# Patient Record
Sex: Male | Born: 1980 | Hispanic: No | Marital: Single
Health system: Midwestern US, Community
[De-identification: ages and names within clinical notes are randomized; demographics above are authoritative.]

---

## 2022-03-11 ENCOUNTER — Inpatient Hospital Stay
Admit: 2022-03-11 | Discharge: 2022-03-11 | Disposition: A | Discharge status: Home / Self Care | Payer: Medicaid Other | Attending: Emergency Medicine

## 2022-03-11 ENCOUNTER — Emergency Department: Admit: 2022-03-11 | Payer: Medicaid Other

## 2022-03-11 DIAGNOSIS — R519 Headache, unspecified: Secondary | ICD-10-CM

## 2022-03-11 DIAGNOSIS — K859 Acute pancreatitis without necrosis or infection, unspecified: Secondary | ICD-10-CM

## 2022-03-11 LAB — CBC WITH AUTO DIFFERENTIAL
Absolute Immature Granulocyte: 0 10*3/uL (ref 0.0–0.5)
Basophils %: 1 % (ref 0.0–2.0)
Basophils Absolute: 0.1 10*3/uL (ref 0.0–0.2)
Eosinophils %: 3 % (ref 0.5–7.8)
Eosinophils Absolute: 0.2 10*3/uL (ref 0.0–0.8)
Hematocrit: 40.7 % — ABNORMAL LOW (ref 41.1–50.3)
Hemoglobin: 14 g/dL (ref 13.6–17.2)
Immature Granulocytes: 0 % (ref 0.0–5.0)
Lymphocytes %: 36 % (ref 13–44)
Lymphocytes Absolute: 3 10*3/uL (ref 0.5–4.6)
MCH: 30.7 PG (ref 26.1–32.9)
MCHC: 34.4 g/dL (ref 31.4–35.0)
MCV: 89.3 FL (ref 82.0–102.0)
MPV: 10.5 FL (ref 9.4–12.3)
Monocytes %: 10 % (ref 4.0–12.0)
Monocytes Absolute: 0.8 10*3/uL (ref 0.1–1.3)
Neutrophils %: 50 % (ref 43–78)
Neutrophils Absolute: 4.1 10*3/uL (ref 1.7–8.2)
Platelets: 170 10*3/uL (ref 150–450)
RBC: 4.56 M/uL (ref 4.23–5.6)
RDW: 12.4 % (ref 11.9–14.6)
WBC: 8.3 10*3/uL (ref 4.3–11.1)
nRBC: 0 10*3/uL (ref 0.0–0.2)

## 2022-03-11 LAB — COMPREHENSIVE METABOLIC PANEL
ALT: 24 U/L (ref 12–65)
AST: 17 U/L (ref 15–37)
Albumin/Globulin Ratio: 1 (ref 0.4–1.6)
Albumin: 3.5 g/dL (ref 3.5–5.0)
Alk Phosphatase: 99 U/L (ref 50–136)
Anion Gap: 8 mmol/L (ref 2–11)
BUN: 16 MG/DL (ref 6–23)
CO2: 27 mmol/L (ref 21–32)
Calcium: 8.9 MG/DL (ref 8.3–10.4)
Chloride: 109 mmol/L (ref 101–110)
Creatinine: 0.94 MG/DL (ref 0.8–1.5)
Est, Glom Filt Rate: >60 mL/min/{1.73_m2} (ref >60)
Globulin: 3.5 g/dL (ref 2.8–4.5)
Glucose: 117 mg/dL — ABNORMAL HIGH (ref 65–100)
Potassium: 3.8 mmol/L (ref 3.5–5.1)
Sodium: 144 mmol/L — ABNORMAL HIGH (ref 133–143)
Total Bilirubin: 0.4 MG/DL (ref 0.2–1.1)
Total Protein: 7 g/dL (ref 6.3–8.2)

## 2022-03-11 LAB — EKG 12-LEAD
Atrial Rate: 68 {beats}/min
P Axis: 49 degrees
P-R Interval: 144 ms
Q-T Interval: 391 ms
QRS Duration: 104 ms
QTc Calculation (Bazett): 416 ms
R Axis: 37 degrees
T Axis: 46 degrees
Ventricular Rate: 68 {beats}/min

## 2022-03-11 LAB — LIPASE: Lipase: 570 U/L — ABNORMAL HIGH (ref 73–393)

## 2022-03-11 MED ORDER — ACETAMINOPHEN 500 MG PO TABS
500 MG | ORAL | Status: DC
Start: 2022-03-11 — End: 2022-03-11

## 2022-03-11 MED ORDER — IBUPROFEN 400 MG PO TABS
400 MG | ORAL | Status: AC
Start: 2022-03-11 — End: 2022-03-11
  Administered 2022-03-11: 07:00:00 400 mg via ORAL

## 2022-03-11 MED FILL — IBUPROFEN 400 MG PO TABS: 400 MG | ORAL | Qty: 1

## 2022-03-11 NOTE — ED Notes (Signed)
I have reviewed discharge instructions with the patient.  The patient verbalized understanding.    Patient left ED via Discharge Method: ambulatory to Home with self.    Opportunity for questions and clarification provided.       Patient given 0 scripts.         To continue your aftercare when you leave the hospital, you may receive an automated call from our care team to check in on how you are doing.  This is a free service and part of our promise to provide the best care and service to meet your aftercare needs." If you have questions, or wish to unsubscribe from this service please call [phone].  Thank you for Choosing our [facility] Emergency Department.        Chase Picket [place], [place]  03/11/22 [phone]

## 2022-03-11 NOTE — Discharge Instructions (Addendum)
Your bloodwork showed eleavted pancreas enzymes otherwise normal. The CT of your head today was normal and did not show any bleeding. Please follow up with your primary care doctor in the next few weeks to have your blood pressure rechecked. Call them today to set up an appointment    [facility]. [facility]  [address] [place] [address]  [phone]

## 2022-03-11 NOTE — ED Provider Notes (Signed)
[name]  Emergency Department    DISPOSITION Decision To Discharge 03/11/2022 03:27:27 AM       ICD-10-CM    1. Acute nonintractable headache, unspecified headache type  R51.9       2. Acute pancreatitis without infection or necrosis, unspecified pancreatitis type  K85.90 [facility] [place]        ED Course     ED Course as of 03/11/22 4163   Fri Mar 11, 2022   5658 41 year old male no prior medical history presents with acute onset of postcoital headache.  Vitals within normal limits.  Neuro intact; NIH 0.  EKG without evidence of STEMI.  Will obtain labs and CT head to rule out aneurysm versus other acute process [ER]   0246 EKG: Normal sinus rhythm, rate 68.  PVCs.  No STEMI.  Normal axis and intervals.  Time: 0244 [ER]   0336 Blood work with elevated lipase otherwise normal.  CT head negative.  No evidence of aneurysm.  Had lengthy discussion with patient regarding results and findings and plan for follow-up.  Blood pressures remained stable here in the ER.  He is neuro intact.  Encouraged to follow-up with PCP and given referral for clinic.  He has had some issues with his pancreas before at this time he has minimal pain is able to tolerate p.o. without difficulty.  Stable for discharge home.  Return precautions given [ER]      ED Course User Index  [ER] Trenton Verne, Mary Sella, MD     Complexity of Problems Addressed:  1 or more stable acute illnesses    Data Reviewed and Analyzed:  Category 1:   I independently ordered and reviewed each unique test.     Category 2:   I independently ordered and interpreted the ED EKG in the absence of a Cardiologist.      Category 3: Discussion of management or test interpretation.  Please see ED course above    Risk of Complications and/or Morbidity of Patient Management:     Is this patient to be included in the SEP-1 core measure due to severe sepsis or septic shock? No Exclusion criteria - the patient is NOT to be  included for SEP-1 Core Measure due to: Infection is not suspected     HPI   Brian Sharp is a 41 y.o. male with a history of none who presents to the ED with complaint of multiple complaints.  States that this evening he had a cute onset of headache.  States headache came on post intercourse.  Has had mild headaches in the past but not normally this painful.  Rates it 5 out of 10.  Does have family history of aneurysm and states multiple of his aunts have died from aneurysms in the past.  Does endorse some tingling to his extremities and some generalized weakness primarily in his lower extremities.  He took his blood pressure at home is known to be systolic 150s.  He does endorse being under significant mount of stress and anxiety recently.  He does take Ativan for this and took that this evening as well.  Denies chest pain, shortness of breath, vision changes, focal weakness.  Also endorses acute on chronic tenderness in his epigastric region.  States he recently moved here from [place].  Symptoms occurred 1 hour prior to arrival    History   History reviewed. No pertinent past medical history.  History reviewed. No pertinent surgical  history.  History reviewed. No pertinent family history.  No Known Allergies    Physical Exam     Vitals:    03/11/22 0155 03/11/22 0245 03/11/22 0300 03/11/22 0322   BP: 132/60 116/67 110/71    Pulse: 71 72 69    Resp: 16 14 18     Temp: 97.7 F (36.5 C)      TempSrc: Oral      SpO2: 97% 95% 96%    Weight:    180 lb (81.6 kg)   Height:    5\' 11"  (1.803 m)     Nursing note and vitals reviewed.    Constitutional: Well developed, NAD  HEENT: Atraumatic, conjugate gaze, EOM intact  Neck: Supple  Cardiovascular: No cyanosis, diaphoresis, or JVD appreciated.   Respiratory: Effort normal. No respiratory distress.   Gastrointestinal: Non-distended. No guarding or rebound.  Very mild epigastric tenderness  MSK: No deformities appreciated. No peripheral edema.  Skin: Skin is warm and dry. No  rash appreciated.  Neuro: Alert and oriented, moves all four extremities.  5/5 strength throughout.  Sensation intact throughout.  Cranial nerves II through XII intact.  Psych: Pleasant and cooperative.    Procedures   Procedures    MDM     Labs Reviewed   CBC WITH AUTO DIFFERENTIAL - Abnormal; Notable for the following components:       Result Value    Hematocrit 40.7 (*)     All other components within normal limits   COMPREHENSIVE METABOLIC PANEL - Abnormal; Notable for the following components:    Sodium 144 (*)     Glucose 117 (*)     All other components within normal limits   LIPASE - Abnormal; Notable for the following components:    Lipase 570 (*)     All other components within normal limits     Medications   ibuprofen (ADVIL;MOTRIN) tablet 400 mg (400 mg Oral Given 03/11/22 0321)     CT Head W/O Contrast   Final Result      No acute intracranial abnormality.          Eliberto Ivory, M.D.    03/11/2022 2:49:00 AM        Voice dictation software was used during the making of this note.  This software is not perfect and grammatical and other typographical errors may be present.  This note has not been completely proofread for errors.     Lanette Hampshire, MD  03/11/22 [phone]

## 2022-03-11 NOTE — ED Triage Notes (Addendum)
Ambulatory to triage, 38 M states his blood pressure has been up and down at home and has been feeling fatigued x1 week. Pt states he took his ativan earlier for anxiety.

## 2022-03-26 ENCOUNTER — Inpatient Hospital Stay
Admit: 2022-03-26 | Discharge: 2022-03-27 | Disposition: A | Discharge status: Home / Self Care | Payer: MEDICAID | Attending: Emergency Medicine

## 2022-03-26 ENCOUNTER — Emergency Department: Admit: 2022-03-26 | Payer: MEDICAID

## 2022-03-26 DIAGNOSIS — R519 Headache, unspecified: Secondary | ICD-10-CM

## 2022-03-26 LAB — COMPREHENSIVE METABOLIC PANEL
ALT: 34 U/L (ref 12–65)
AST: 25 U/L (ref 15–37)
Albumin/Globulin Ratio: 1.1 (ref 0.4–1.6)
Albumin: 3.9 g/dL (ref 3.5–5.0)
Alk Phosphatase: 117 U/L (ref 50–136)
Anion Gap: 6 mmol/L (ref 2–11)
BUN: 15 MG/DL (ref 6–23)
CO2: 26 mmol/L (ref 21–32)
Calcium: 9 MG/DL (ref 8.3–10.4)
Chloride: 111 mmol/L — ABNORMAL HIGH (ref 101–110)
Creatinine: 1.1 MG/DL (ref 0.8–1.5)
Est, Glom Filt Rate: >60 mL/min/{1.73_m2} (ref >60)
Globulin: 3.5 g/dL (ref 2.8–4.5)
Glucose: 106 mg/dL — ABNORMAL HIGH (ref 65–100)
Potassium: 4.1 mmol/L (ref 3.5–5.1)
Sodium: 143 mmol/L (ref 133–143)
Total Bilirubin: 0.4 MG/DL (ref 0.2–1.1)
Total Protein: 7.4 g/dL (ref 6.3–8.2)

## 2022-03-26 LAB — POCT GLUCOSE
POC Glucose: 112 mg/dL — ABNORMAL HIGH (ref 65–100)
QC OK?: 112

## 2022-03-26 LAB — CBC WITH AUTO DIFFERENTIAL
Absolute Immature Granulocyte: 0 10*3/uL (ref 0.0–0.5)
Basophils %: 1 % (ref 0.0–2.0)
Basophils Absolute: 0 10*3/uL (ref 0.0–0.2)
Eosinophils %: 3 % (ref 0.5–7.8)
Eosinophils Absolute: 0.2 10*3/uL (ref 0.0–0.8)
Hematocrit: 44.4 % (ref 41.1–50.3)
Hemoglobin: 15.2 g/dL (ref 13.6–17.2)
Immature Granulocytes: 0 % (ref 0.0–5.0)
Lymphocytes %: 33 % (ref 13–44)
Lymphocytes Absolute: 2.6 10*3/uL (ref 0.5–4.6)
MCH: 30.6 PG (ref 26.1–32.9)
MCHC: 34.2 g/dL (ref 31.4–35.0)
MCV: 89.5 FL (ref 82–102)
MPV: 10.7 FL (ref 9.4–12.3)
Monocytes %: 11 % (ref 4.0–12.0)
Monocytes Absolute: 0.8 10*3/uL (ref 0.1–1.3)
Neutrophils %: 52 % (ref 43–78)
Neutrophils Absolute: 4 10*3/uL (ref 1.7–8.2)
Platelets: 183 10*3/uL (ref 150–450)
RBC: 4.96 M/uL (ref 4.23–5.6)
RDW: 12.4 % (ref 11.9–14.6)
WBC: 7.7 10*3/uL (ref 4.3–11.1)
nRBC: 0 10*3/uL (ref 0.0–0.2)

## 2022-03-26 LAB — TROPONIN: Troponin, High Sensitivity: 4.9 pg/mL (ref 0–14)

## 2022-03-26 LAB — PROTIME-INR
INR: 0.9
Protime: 12.3 s — ABNORMAL LOW (ref 12.6–14.3)

## 2022-03-26 MED ORDER — SODIUM CHLORIDE 0.9 % IV BOLUS
0.9 % | Freq: Once | INTRAVENOUS | Status: AC
Start: 2022-03-26 — End: 2022-03-26
  Administered 2022-03-26: 500 mL via INTRAVENOUS

## 2022-03-26 MED ORDER — KETOROLAC TROMETHAMINE 15 MG/ML IJ SOLN
15 MG/ML | INTRAMUSCULAR | Status: AC
Start: 2022-03-26 — End: 2022-03-26
  Administered 2022-03-26: 15 mg via INTRAVENOUS

## 2022-03-26 MED ORDER — IOPAMIDOL 76 % IV SOLN
76 % | Freq: Once | INTRAVENOUS | Status: AC | PRN
Start: 2022-03-26 — End: 2022-03-26
  Administered 2022-03-26: 100 mL via INTRAVENOUS

## 2022-03-26 MED FILL — KETOROLAC TROMETHAMINE 15 MG/ML IJ SOLN: 15 MG/ML | INTRAMUSCULAR | Qty: 1

## 2022-03-26 NOTE — ED Triage Notes (Signed)
Pt arrives with c/o numbness to left side of face, headache, tingling sensation to left side of face and left arm since waking up yesterday morning at 0700. States this morning he woke and noticed upper eyelid on left side was drooping and he had blurry vision. Also endorses nausea and mid abdominal pain. Has hx of pancreatitis.

## 2022-03-26 NOTE — ED Notes (Signed)
I have reviewed discharge instructions with the patient.  The patient verbalized understanding.    Patient left ED via Discharge Method: ambulatory to Home with self.    Opportunity for questions and clarification provided.       Patient given 1 scripts.         To continue your aftercare when you leave the hospital, you may receive an automated call from our care team to check in on how you are doing.  This is a free service and part of our promise to provide the best care and service to meet your aftercare needs." If you have questions, or wish to unsubscribe from this service please call [phone].  Thank you for Choosing our [facility] Emergency Department.        Jenne Campus, RN  03/26/22 2122

## 2022-03-26 NOTE — Discharge Instructions (Addendum)
As discussed your work-up here in the emergency department today was reassuring.  I do recommend close follow-up with primary care as planned next week.    As we discussed you can trial the Vistaril for anxiety, it can make you a little bit sleepy which can also help with your trouble falling asleep.  He only take this medication as needed when you are feeling anxious.    Follow-up with your PCP in 1 to 2 days if no improvement.  Return to the ER for any new or worsening symptoms.

## 2022-03-26 NOTE — ED Provider Notes (Addendum)
Emergency Department Provider Note       PCP: No, Pcp   Age: 41 y.o.   Sex: male     DISPOSITION Discharge - Pending Orders Complete 03/26/2022 08:47:36 PM       ICD-10-CM    1. Acute nonintractable headache, unspecified headache type  R51.9           Medical Decision Making     Complexity of Problems Addressed:  1 or more acute illnesses that pose a threat to life or bodily function.     Data Reviewed and Analyzed:   I independently ordered and reviewed each unique test.  I reviewed external records: ED visit note from an outside group.     I independently ordered and interpreted the ED EKG in the absence of a Cardiologist.    Rate: 79  EKG Interpretation: EKG Interpretation: sinus rhythm, no evidence of arrhythmia  ST Segments: Normal ST segments - NO STEMI      I interpreted the CT Scan no intracranial hemorrhage, no obvious arterial stenosis or occlusion on my limited read, agree with radiologist interpretation.    Discussion of management or test interpretation.  41 year old male presenting to the emergency department today for evaluation of left-sided headache, blurred vision and paresthesias in the left arm and leg.  He woke up with the symptoms yesterday.  Patient is overall very well-appearing here today.  He has no gross neurologic deficits on exam though reports some decreased sensation on the left side of the face, arm and leg but no complete numbness.  He has no weakness and is ambulatory without difficulty.    Discussed broad Ddx including ICH, CVA, hydrocephalus, migraine, cluster headache, anxiety, mass, electrolyte abnormality, aneurysm, bell's palsy. CT head without contrast and CTA are both negative for acute abnormality here today.  His labs are all reassuring and EKG does not show any signs of ischemia.  I discussed reassuring findings here in the emergency department with patient.  He does think that there may be some underlying anxiety component which I think is a possibility as well.  He  states he has been under increased stress recently since him, his wife and child moved to [place] away from their family in [place].  He states that he has an appointment set up with a new PCP next week for the first time to establish care, I encouraged him to keep this appointment for reevaluation.  We discussed symptoms that would warrant reevaluation in the emergency department.  He verbalized understanding and feels comfortable to be discharged home at this time.    I discussed this patient and all results with my attending, Dr. Lester Kinsman who agrees with disposition and plan of patient.      Risk of Complications and/or Morbidity of Patient Management:  Patient was discharged risks and benefits of hospitalization were considered.  Shared medical decision making was utilized in creating the patients health plan today.    ED Course as of 03/26/22 2100   Sat Mar 26, 2022   2026 CTA HEAD NECK W CONTRAST [KE]   2043 I reviewed all results with patient from work-up here in the emergency department.  He states that he thinks there may be some underlying anxiety component to his symptoms as he does have a history of anxiety and panic attacks in the past.  He states he was previously treated with Xanax but he did not like the way that it made him feel so he is not currently on  any medications.  He does have an appointment with a new primary care provider next week.  Discussed with patient that overall his work-up has been reassuring, he really has no risk factors for CVA, and I think an underlying anxiety component is certainly possible.  He would like to trial some Vistaril here and at home and follow-up with his primary care provider.  He will return to the ER if he develops any new or worsening symptoms. [KE]      ED Course User Index  [KE] Thayer Dallas, PA       Is this patient to be included in the SEP-1 core measure due to severe sepsis or septic shock? No Exclusion criteria - the patient is NOT to be  included for SEP-1 Core Measure due to: Infection is not suspected      History      41 year old male presenting to the emergency department today for evaluation of headache, blurred vision in the left eye and numbness on the left side of his face and in his upper and lower extremity on the left side.  He woke up with the symptoms yesterday morning. He comes in today as symptoms have not resolved. He denies any associated neck pain. He denies any history of migraines.  He was seen here 2 weeks ago for similar symptoms and had normal workup.  He also endorses some left sided chest pain as well today, no aggravating or alleviating factors. No pain currently.Marland Kitchen  He denies shortness of breath.  He reports some mild lightheadedness.  Denies any abdominal pain, nausea or vomiting.  He reports a history of anxiety but he is not currently on any anxiety medications.  He endorses a family history of aneurysms, stating 2 aunts have died from aneurysms which is his main concern today.    The history is provided by the patient. No language interpreter was used.        Review of Systems   Constitutional:  Negative for chills and fever.   Respiratory:  Negative for shortness of breath.    Cardiovascular:  Positive for chest pain.   Neurological:  Positive for numbness and headaches. Negative for speech difficulty.       Physical Exam     Vitals signs and nursing note reviewed:  Vitals:    03/26/22 1730 03/26/22 1930 03/26/22 1931   BP: (!) 144/80 122/80    Pulse: 92     Resp: 16     Temp: 98.2 F (36.8 C)     TempSrc: Oral     SpO2: 100%  97%   Weight: 81.6 kg (180 lb)     Height: 1.803 m (_0 )        Physical Exam  Vitals and nursing note reviewed.   Constitutional:       General: He is not in acute distress.     Appearance: Normal appearance.   HENT:      Head: Normocephalic and atraumatic.      Nose: Nose normal.   Eyes:      Extraocular Movements: Extraocular movements intact.      Pupils: Pupils are equal, round, and  reactive to light.   Cardiovascular:      Rate and Rhythm: Normal rate and regular rhythm.      Heart sounds: No murmur heard.  Pulmonary:      Effort: Pulmonary effort is normal.      Breath sounds: No wheezing, rhonchi or rales.   Skin:  General: Skin is warm and dry.      Capillary Refill: Capillary refill takes less than 2 seconds.   Neurological:      General: No focal deficit present.      Mental Status: He is alert and oriented to person, place, and time.      GCS: GCS eye subscore is 4. GCS verbal subscore is 5. GCS motor subscore is 6.      Cranial Nerves: Cranial nerves 2-12 are intact. No cranial nerve deficit, dysarthria or facial asymmetry.      Motor: Motor function is intact. No weakness or pronator drift.      Coordination: Coordination is intact. Finger-Nose-Finger Test and Heel to [facility] Test normal. Rapid alternating movements normal.      Comments: Subjective decreased sensation of left side of face, left arm, left leg   Psychiatric:         Mood and Affect: Mood normal.         Thought Content: Thought content normal.         Judgment: Judgment normal.          Procedures     Procedures    Orders Placed This Encounter   Procedures    CT HEAD WO CONTRAST    CTA HEAD NECK W CONTRAST    CBC with Auto Differential    Comprehensive Metabolic Panel    Protime-INR    Troponin    Diet NPO    NIHSS    Nursing swallow assessment    Visual acuity screening    POCT Glucose    POCT Glucose    EKG 12 Lead        Medications given during this emergency department visit:  Medications   hydrOXYzine pamoate (VISTARIL) capsule 25 mg (has no administration in time range)   sodium chloride 0.9 % bolus 500 mL (0 mLs IntraVENous Stopped 03/26/22 2022)   ketorolac (TORADOL) injection 15 mg (15 mg IntraVENous Given 03/26/22 1934)   iopamidol (ISOVUE-370) 76 % injection 100 mL (100 mLs IntraVENous Given 03/26/22 1948)       New Prescriptions    HYDROXYZINE PAMOATE (VISTARIL) 25 MG CAPSULE    Take 1 capsule by mouth 3  times daily as needed for Anxiety        No past medical history on file.     No past surgical history on file.     Social History     Socioeconomic History    Marital status: Single        Previous Medications    No medications on file        Results for orders placed or performed during the hospital encounter of 03/26/22   CT HEAD WO CONTRAST    Narrative    EXAMINATION: CT HEAD WITHOUT CONTRAST    DATE: 03/26/2022 6:11 PM    INDICATION: Left-sided tingling and blurred vision.    COMPARISON: CT head 03/11/2022    TECHNIQUE: Noncontrast CT imaging through the brain was performed in the axial   plane. Reformats were generated in one or more orthogonal planes, including   sagittal and/or coronal. CT dose lowering techniques were used, to include:   automated exposure control, adjustment for patient size, and or use of iterative   reconstruction.    FINDINGS:    Intracranial contents:    The ventricles and sulci are normal in size and configuration.  There is no   midline shift or mass effect.  There is  no abnormal extra-axial fluid collection   or acute intracranial hemorrhage.    Gray-white interface appears distinct, with no evidence of large territory   ischemic change.    Bones and extracranial soft tissues:    The calvarium is intact. The visualized paranasal sinuses and mastoid air cells   are clear.        Impression    No acute intracranial abnormality is identified. No apparent change since the   prior head CT. If there is high suspicion for acute ischemic infarct, MRI would   be more sensitive.                 Marjean Donna, M.D.   03/26/2022 6:44:00 PM   CTA HEAD NECK W CONTRAST    Narrative    EXAMINATION: CT ANGIOGRAPHY HEAD AND NECK    DATE:  03/26/2022 7:50 PM    INDICATION: Left-sided numbness in the face and extremities. Left-sided   headache. Family history of aneurysm.    COMPARISON: Noncontrast CT head 03/18/2022    TECHNIQUE: CT angiography through the head and neck was performed in the axial    plane using 50 mL of Isovue-370 administered intravenously, without adverse   reaction. Coronal and sagittal MIP and MPR images were generated. CT dose   lowering techniques were used, to include: automated exposure control,   adjustment for patient size, and or use of iterative reconstruction. Stenoses   measured based on NASCET criteria.    FINDINGS:    CTA neck:    Arch and great vessels:    The visualized aortic arch and great vessel origins are patent.    Right carotid:    The common carotid, internal carotid, and external carotid arteries are without   occlusion, significant stenosis, or evidence of dissection.    Left carotid:    The common carotid, internal carotid, and external carotid arteries are without   occlusion, significant stenosis, or evidence of dissection.    Vertebral arteries:    The vertebral arteries are uniform in caliber, with no occlusion, significant   stenosis, or evidence of dissection.    Osseous and soft tissue structures:    Cervical levoconvexity and mild to moderate lordotic reversal. Mild disc bulge   at C5-6 and mild facet hypertrophy at this level. No significant bony stenosis   of the cervical spinal canal or neural foramina.      CTA Head:    Anterior Circulation:    Bilateral internal carotid, anterior cerebral, and middle cerebral arteries are   normal in caliber, with no occlusion, significant stenosis, or aneurysmal   dilation. 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    Posterior Circulation:    The vertebrobasilar system, superior cerebellar arteries, and posterior cerebral   arteries are normal in caliber, with no occlusion, significant stenosis, or   aneurysmal dilation.          Impression    CTA NECK:    1. No occlusion, significant stenosis, or evidence of dissection in the cervical   carotid or vertebral arteries.    2. Mild cervical spine degenerative changes at C5-6.      CTA HEAD:    No large vessel occlusion or significant stenosis of the major  intracranial   arteries         Marjean Donna, M.D.   03/26/2022 8:23:00 PM   CBC with Auto Differential   Result Value Ref Range    WBC 7.7 4.3 - 11.1 K/uL  RBC 4.96 4.23 - 5.6 M/uL    Hemoglobin 15.2 13.6 - 17.2 g/dL    Hematocrit 44.4 41.1 - 50.3 %    MCV 89.5 82 - 102 FL    MCH 30.6 26.1 - 32.9 PG    MCHC 34.2 31.4 - 35.0 g/dL    RDW 12.4 11.9 - 14.6 %    Platelets 183 150 - 450 K/uL    MPV 10.7 9.4 - 12.3 FL    nRBC 0.00 0.0 - 0.2 K/uL    Differential Type AUTOMATED      Neutrophils % 52 43 - 78 %    Lymphocytes % 33 13 - 44 %    Monocytes % 11 4.0 - 12.0 %    Eosinophils % 3 0.5 - 7.8 %    Basophils % 1 0.0 - 2.0 %    Immature Granulocytes 0 0.0 - 5.0 %    Neutrophils Absolute 4.0 1.7 - 8.2 K/UL    Lymphocytes Absolute 2.6 0.5 - 4.6 K/UL    Monocytes Absolute 0.8 0.1 - 1.3 K/UL    Eosinophils Absolute 0.2 0.0 - 0.8 K/UL    Basophils Absolute 0.0 0.0 - 0.2 K/UL    Absolute Immature Granulocyte 0.0 0.0 - 0.5 K/UL   Comprehensive Metabolic Panel   Result Value Ref Range    Sodium 143 133 - 143 mmol/L    Potassium 4.1 3.5 - 5.1 mmol/L    Chloride 111 (H) 101 - 110 mmol/L    CO2 26 21 - 32 mmol/L    Anion Gap 6 2 - 11 mmol/L    Glucose 106 (H) 65 - 100 mg/dL    BUN 15 6 - 23 MG/DL    Creatinine 1.10 0.8 - 1.5 MG/DL    Est, Glom Filt Rate >60 >60 ml/min/1.7m    Calcium 9.0 8.3 - 10.4 MG/DL    Total Bilirubin 0.4 0.2 - 1.1 MG/DL    ALT 34 12 - 65 U/L    AST 25 15 - 37 U/L    Alk Phosphatase 117 50 - 136 U/L    Total Protein 7.4 6.3 - 8.2 g/dL    Albumin 3.9 3.5 - 5.0 g/dL    Globulin 3.5 2.8 - 4.5 g/dL    Albumin/Globulin Ratio 1.1 0.4 - 1.6     Protime-INR   Result Value Ref Range    Protime 12.3 (L) 12.6 - 14.3 sec    INR 0.9     Troponin   Result Value Ref Range    Troponin, High Sensitivity 4.9 0 - 14 pg/mL   POCT Glucose   Result Value Ref Range    QC OK? 112    POCT Glucose   Result Value Ref Range    POC Glucose 112 (H) 65 - 100 mg/dL    Performed by: HudsonKrystalADN    EKG 12 Lead   Result Value Ref  Range    Ventricular Rate 79 BPM    Atrial Rate 79 BPM    P-R Interval 132 ms    QRS Duration 90 ms    Q-T Interval 360 ms    QTc Calculation (Bazett) 412 ms    P Axis 59 degrees    R Axis 59 degrees    T Axis 51 degrees    Diagnosis       Normal sinus rhythm with sinus arrhythmia  Normal ECG  When compared with ECG of 11-Mar-2022 02:44,  PREVIOUS ECG IS PRESENT  CTA HEAD NECK W CONTRAST   Final Result         CTA NECK:      1. No occlusion, significant stenosis, or evidence of dissection in the cervical    carotid or vertebral arteries.      2. Mild cervical spine degenerative changes at C5-6.         CTA HEAD:      No large vessel occlusion or significant stenosis of the major intracranial    arteries             Marjean Donna, M.D.    03/26/2022 8:23:00 PM      CT HEAD WO CONTRAST   Final Result      No acute intracranial abnormality is identified. No apparent change since the    prior head CT. If there is high suspicion for acute ischemic infarct, MRI would    be more sensitive.                         Marjean Donna, M.D.    03/26/2022 6:44:00 PM           NIH Stroke Scale  Interval: Reassessment  Level of Consciousness (1a): Alert  LOC Questions (1b): Answers both correctly  LOC Commands (1c): Performs both tasks correctly  Best Gaze (2): Normal  Visual (3): No visual loss  Facial Palsy (4): Normal symmetrical movement  Motor Arm, Left (5a): No drift  Motor Arm, Right (5b): No drift  Motor Leg, Left (6a): No drift  Motor Leg, Right (6b): No drift  Limb Ataxia (7): Absent  Sensory (8): (!) Mild to Moderate  Best Language (9): No aphasia  Dysarthria (10): Normal  Extinction and Inattention (11): No abnormality  Total: 1            Voice dictation software was used during the making of this note.  This software is not perfect and grammatical and other typographical errors may be present.  This note has not been completely proofread for errors.       Thayer Dallas, [place]  03/26/22 2056       Thayer Dallas,  [place]  03/26/22 2100

## 2022-03-27 LAB — EKG 12-LEAD
Atrial Rate: 79 {beats}/min
Diagnosis: NORMAL
P Axis: 59 degrees
P-R Interval: 132 ms
Q-T Interval: 360 ms
QRS Duration: 90 ms
QTc Calculation (Bazett): 412 ms
R Axis: 59 degrees
T Axis: 51 degrees
Ventricular Rate: 79 {beats}/min

## 2022-03-27 MED ORDER — HYDROXYZINE PAMOATE 25 MG PO CAPS
25 MG | ORAL_CAPSULE | Freq: Three times a day (TID) | ORAL | 0 refills | Status: AC | PRN
Start: 2022-03-27 — End: 2022-04-09

## 2022-03-27 MED ORDER — HYDROXYZINE PAMOATE 25 MG PO CAPS
25 MG | ORAL | Status: AC
Start: 2022-03-27 — End: 2022-03-26
  Administered 2022-03-27: 01:00:00 25 mg via ORAL

## 2022-03-27 MED FILL — HYDROXYZINE PAMOATE 25 MG PO CAPS: 25 MG | ORAL | Qty: 1
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
